# Patient Record
Sex: Male | Born: 1995 | Hispanic: No | Marital: Single | Smoking: Never smoker
Health system: Southern US, Community
[De-identification: ages and names within clinical notes are randomized; demographics above are authoritative.]

---
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
## 2016-09-13 IMAGING — DX DG ANKLE COMPLETE 3+V*R*
3 series · 3 of 3 positions shown · non-contrast
Comparison: None.

CLINICAL DATA: running and fell, twisting right ankle. Pain,
swelling, inversion injury.

EXAM:
RIGHT ANKLE - COMPLETE 3+ VIEW

[ankle ap]
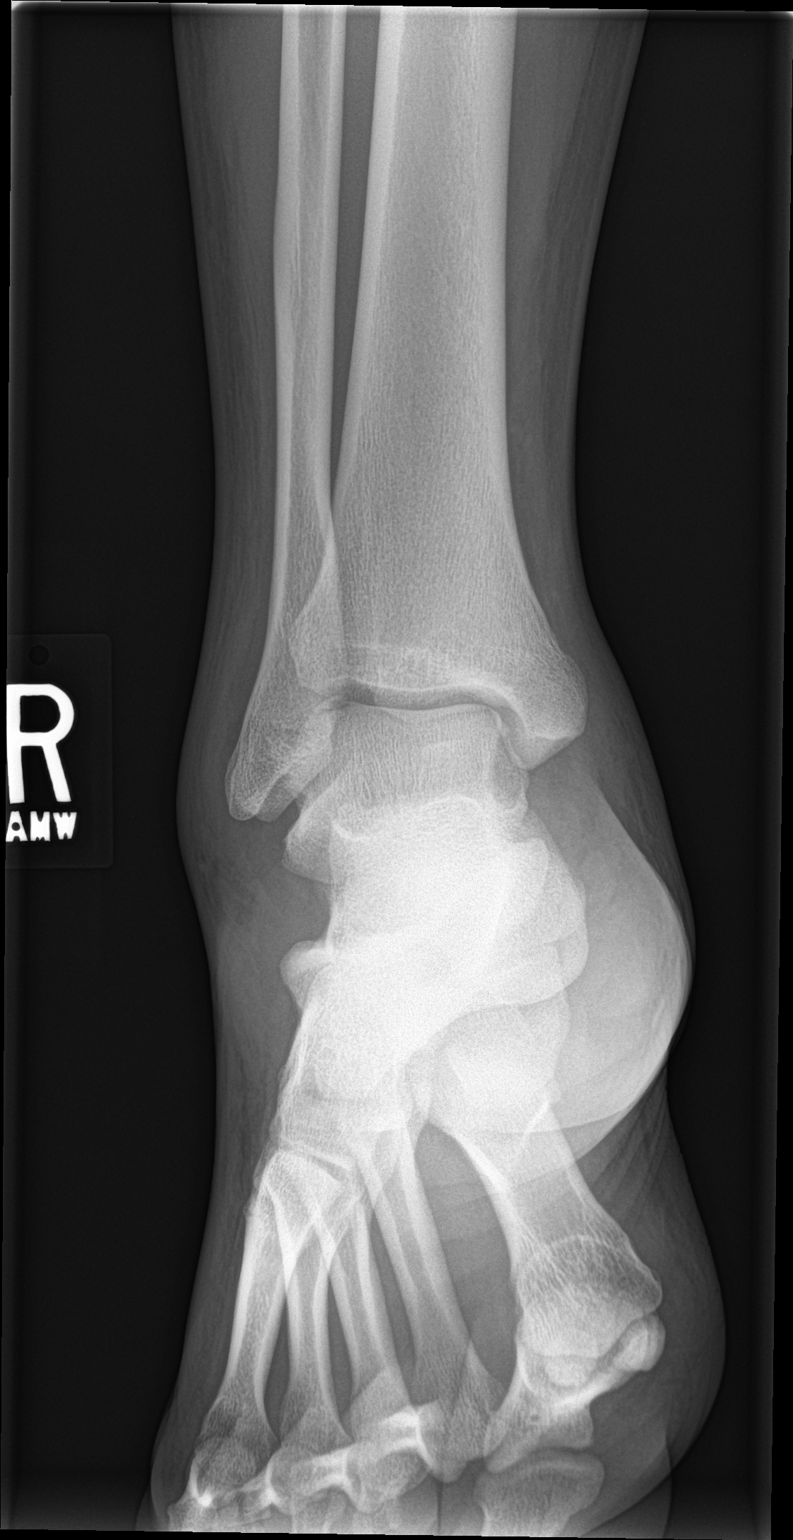

[ankle obl]
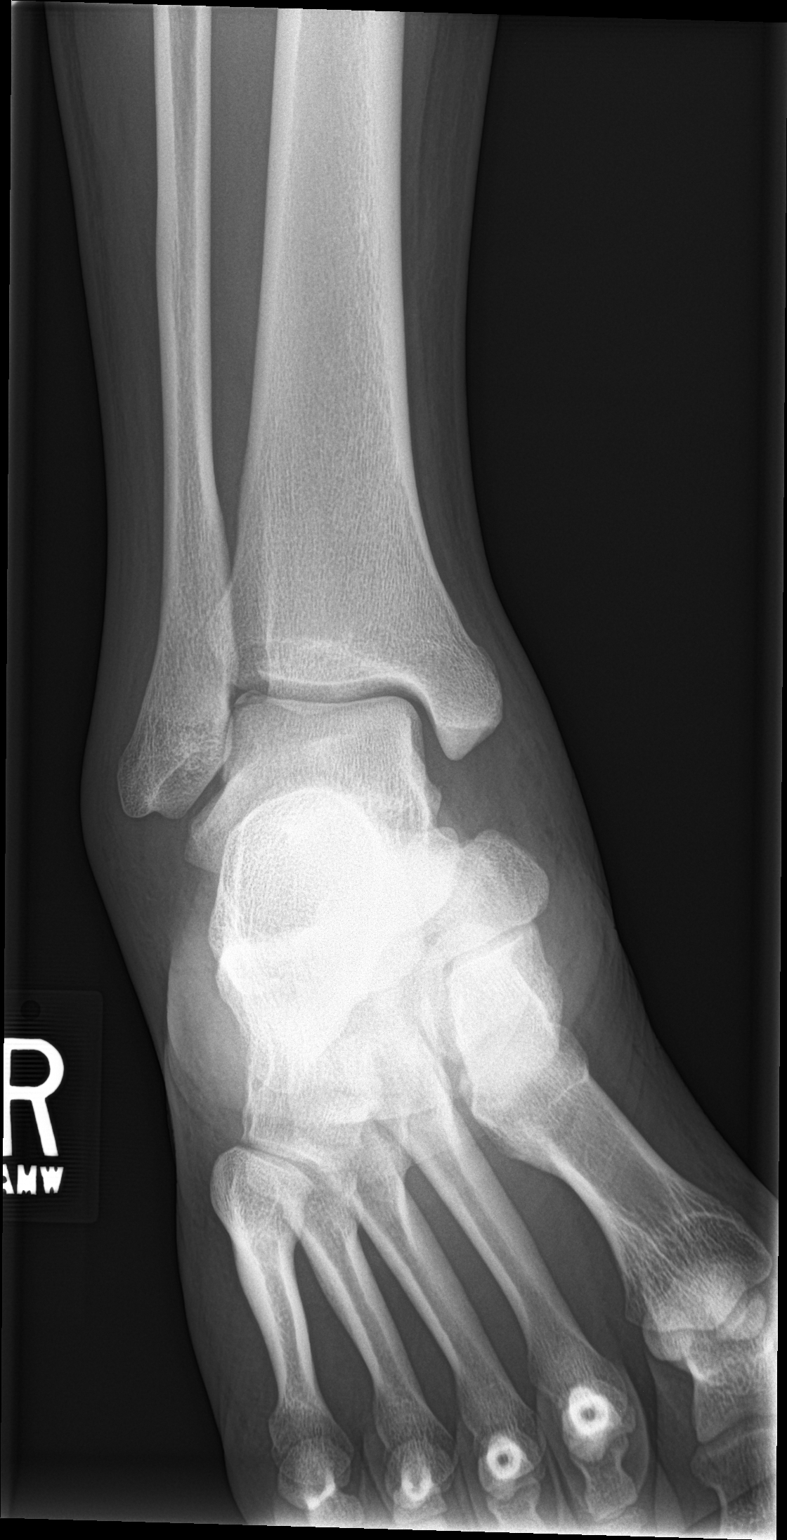

[ankle lat]
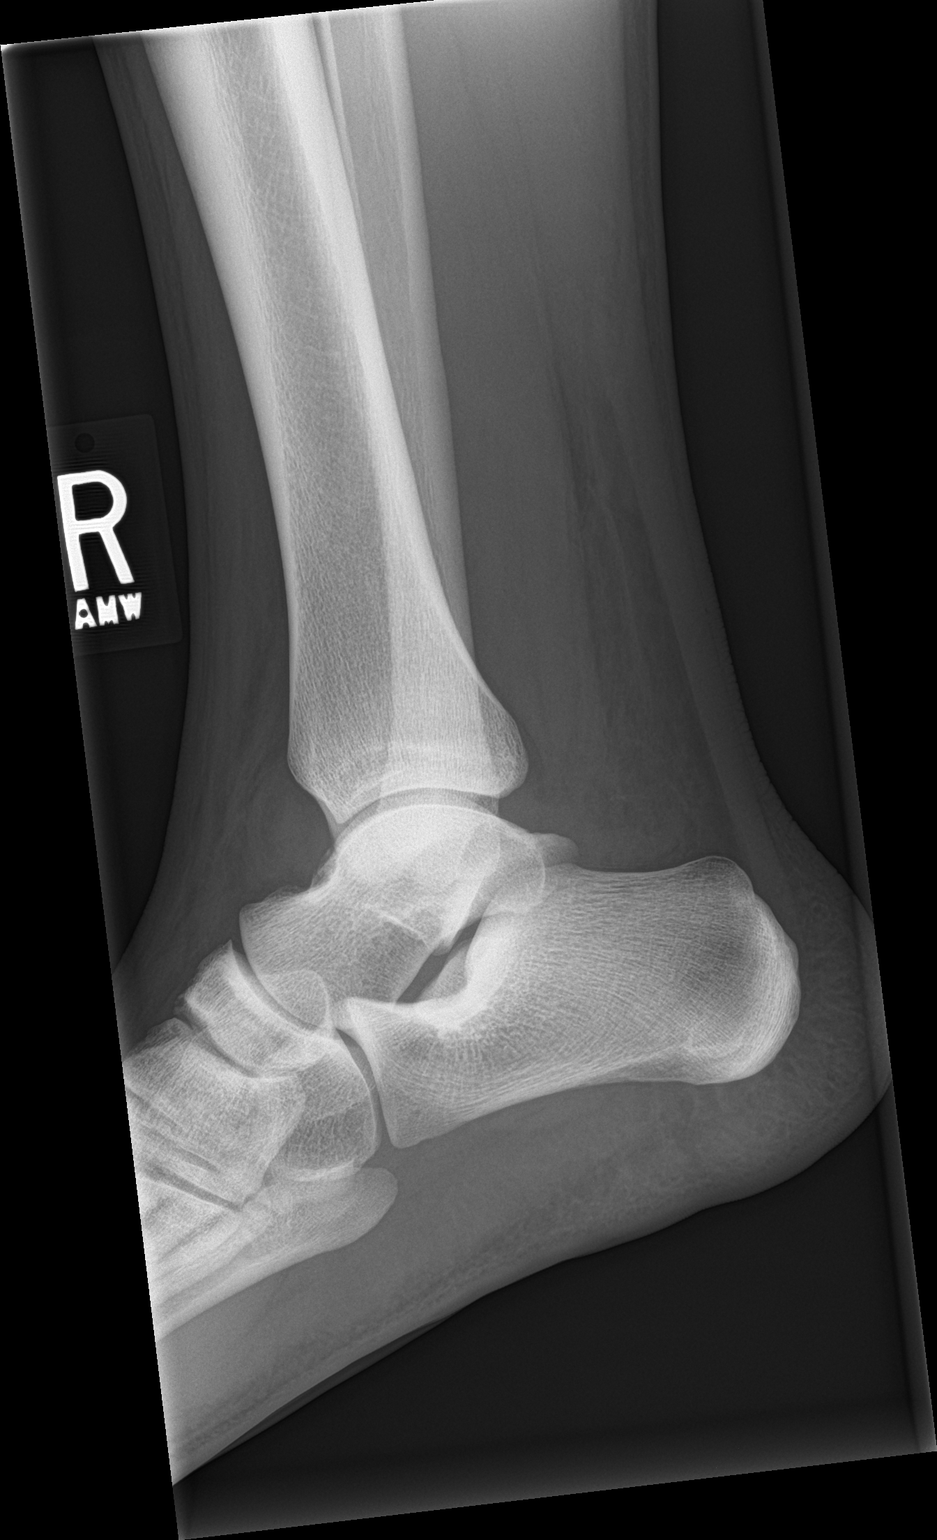

[3 of 3 positions shown; findings below may reference images not displayed]

FINDINGS: Diffuse soft tissue swelling. Small joint effusion. No underlying
bony abnormality. No fracture, subluxation or dislocation.
IMPRESSION: Diffuse soft tissue swelling with right ankle joint effusion. No
acute bony abnormality.
